# Patient Record
Sex: Female | Born: 1945 | Race: Asian | Hispanic: No | Smoking: Never smoker
Health system: Southern US, Community
[De-identification: ages and names within clinical notes are randomized; demographics above are authoritative.]

## PROBLEM LIST (undated history)

## (undated) DIAGNOSIS — B181 Chronic viral hepatitis B without delta-agent: Secondary | ICD-10-CM

## (undated) DIAGNOSIS — I10 Essential (primary) hypertension: Secondary | ICD-10-CM

## (undated) DIAGNOSIS — M1990 Unspecified osteoarthritis, unspecified site: Secondary | ICD-10-CM

## (undated) DIAGNOSIS — K255 Chronic or unspecified gastric ulcer with perforation: Secondary | ICD-10-CM

## (undated) DIAGNOSIS — M810 Age-related osteoporosis without current pathological fracture: Secondary | ICD-10-CM

## (undated) HISTORY — DX: Chronic or unspecified gastric ulcer with perforation: K25.5

## (undated) HISTORY — DX: Chronic viral hepatitis B without delta-agent: B18.1

## (undated) HISTORY — DX: Essential (primary) hypertension: I10

## (undated) HISTORY — DX: Unspecified osteoarthritis, unspecified site: M19.90

---

## 1992-02-29 HISTORY — PX: OTHER SURGICAL HISTORY: SHX169

## 2010-07-07 ENCOUNTER — Encounter: Payer: Self-pay | Admitting: Family Medicine

## 2010-07-07 ENCOUNTER — Ambulatory Visit (INDEPENDENT_AMBULATORY_CARE_PROVIDER_SITE_OTHER): Payer: Medicare Other | Admitting: Family Medicine

## 2010-07-07 VITALS — BP 157/88 | HR 78 | Ht 59.0 in | Wt 90.0 lb

## 2010-07-07 DIAGNOSIS — R634 Abnormal weight loss: Secondary | ICD-10-CM

## 2010-07-07 DIAGNOSIS — Z0000 Encounter for general adult medical examination without abnormal findings: Secondary | ICD-10-CM

## 2010-07-07 DIAGNOSIS — Z130 Encounter for screening for diseases of the blood and blood-forming organs and certain disorders involving the immune mechanism: Secondary | ICD-10-CM

## 2010-07-07 DIAGNOSIS — Z780 Asymptomatic menopausal state: Secondary | ICD-10-CM

## 2010-07-07 DIAGNOSIS — Z23 Encounter for immunization: Secondary | ICD-10-CM

## 2010-07-07 DIAGNOSIS — Z13228 Encounter for screening for other metabolic disorders: Secondary | ICD-10-CM

## 2010-07-07 DIAGNOSIS — Z13220 Encounter for screening for lipoid disorders: Secondary | ICD-10-CM

## 2010-07-07 MED ORDER — PNEUMOCOCCAL VAC POLYVALENT 25 MCG/0.5ML IJ INJ: 0.5000 mL | INJECTION | Freq: Once | INTRAMUSCULAR | Status: DC

## 2010-07-07 MED ORDER — TETANUS-DIPHTH-ACELL PERTUSSIS 5-2.5-18.5 LF-MCG/0.5 IM SUSP: 0.5000 mL | Freq: Once | INTRAMUSCULAR | Status: DC

## 2010-07-07 MED ORDER — BETAMETHASONE DIPROPIONATE 0.05 % EX LOTN: TOPICAL_LOTION | Freq: Two times a day (BID) | CUTANEOUS | Status: AC

## 2010-07-07 NOTE — Patient Instructions (Signed)
Update fasting labs one morning downstairs.  Call [phone] to schedule your bone density test downstairs.  Take MVI + Citrical with D twice daily.

## 2010-07-07 NOTE — Progress Notes (Signed)
  Subjective:    Patient ID: Debra Garcia, female    DOB: [birth year], 65 y.o.   MRN: [id number]  HPI 65 yo [place] female presents for NOV physical.  It has been years since her last physical.  She is a previously healthy female.  She has declined doing mammograms and pap smears.  She has been widowed since 2001 and denies vaginal bleeding or discharge.  She is overdue for a colonoscopy and reports weight loss over the past 6 mos.  She has fam hx of GI cancer.  She is due for fasting labs and a bone density scan.  She rarely takes calcium supplement.  Denies chest pain or DOE.  BP 157/88  Pulse 78  Ht 4\' 11"  (1.499 m)  Wt 90 lb (40.824 kg)  BMI 18.18 kg/m2  SpO2 98%  History reviewed. No pertinent past medical history.  Past Surgical History  Procedure Date  . Ulcer surgery 1994    Family History  Problem Relation Age of Onset  . Cancer Brother     History   Social History  . Marital Status: Unknown    Spouse Name: widowed    Number of Children: 2  . Years of Education: N/A   Occupational History  . retired    Social History Main Topics  . Smoking status: Never Smoker   . Smokeless tobacco: Not on file  . Alcohol Use: No  . Drug Use: No  . Sexually Active: No   Other Topics Concern  . Not on file   Social History Narrative  . No narrative on file    Not on File  Current outpatient prescriptions:betamethasone dipropionate 0.05 % lotion, Apply topically 2 (two) times daily., Disp: 60 mL, Rfl: 0 Current facility-administered medications:pneumococcal 23 valent vaccine (PNU-IMMUNE) injection 0.5 mL, 0.5 mL, Intramuscular, Once, Seymour Bars, DO;  TDaP (BOOSTRIX) injection 0.5 mL, 0.5 mL, Intramuscular, Once, Seymour Bars, DO       Review of Systems + itchy patch of skin on back;  Gen: no fevers, chills, hot flashes, night sweats, change in weight GI: no N/V/C/D GU: no dysuria, incontinence or sexual dysfunction CV: no chest pain, DOE, palpitations s or edema Pulm:   Denies CP, SOB or chronic cough     Objective:   Physical Exam    Gen: alert, well groomed in NAD,m underwt.  Son is here to translate. Neck: no thyromegaly or cervical lymphadenopathy CV: RRR w/o murmur, no audible carotid bruits or abdominal aortic bruits Ext: no edema, clubbing or cyanosis Lungs: CTA bilat w/o W/R/R; nonlabored HEENT:  [place]/AT; PERRLA; oropharynx pink and moist with good dentition Abd: soft, NT, ND, NABS, No HSM, no audible AA bruits Skin: warm and dry; no rash, pallor or jaundice Psych: does not appear anxious or depressed; answers questions appropriately     Assessment & Plan:  Assesment:  1. CPE- Keeping healthy checklist for women reviewed today.  BP at goal.  BMI 18 in the underwt range.       Labs ordered Colonoscopy/ EGD scheduled. Pap due but she is low risk and declined updating. Mammogram due but she declined. Encouraged healthy diet, regular exercise, MVI daily.Citrical with D 2 x a day. Return for next physical in 1 yr.   DEXA scheduled. Pneumovax and Tdap today.

## 2010-07-17 ENCOUNTER — Telehealth: Payer: Self-pay | Admitting: Family Medicine

## 2010-07-17 LAB — COMPLETE METABOLIC PANEL WITH GFR
AST: 23 U/L (ref 0–37)
Albumin: 4.5 g/dL (ref 3.5–5.2)
Alkaline Phosphatase: 75 U/L (ref 39–117)
Potassium: 4.2 mEq/L (ref 3.5–5.3)
Sodium: 141 mEq/L (ref 135–145)
Total Protein: 7 g/dL (ref 6.0–8.3)

## 2010-07-17 LAB — CBC WITH DIFFERENTIAL/PLATELET
Basophils Absolute: 0.1 10*3/uL (ref 0.0–0.1)
Eosinophils Relative: 2 % (ref 0–5)
HCT: 42.3 % (ref 36.0–46.0)
Hemoglobin: 13.6 g/dL (ref 12.0–15.0)
Lymphocytes Relative: 40 % (ref 12–46)
MCHC: 32.2 g/dL (ref 30.0–36.0)
MCV: 91.2 fL (ref 78.0–100.0)
Monocytes Absolute: 0.4 10*3/uL (ref 0.1–1.0)
Monocytes Relative: 11 % (ref 3–12)
RDW: 13.2 % (ref 11.5–15.5)
WBC: 3.8 10*3/uL — ABNORMAL LOW (ref 4.0–10.5)

## 2010-07-17 NOTE — Telephone Encounter (Signed)
Pls let pt know that her fasting sugar, liver and kidney function came back normal.  Thyroid function and hemoglobin are normal.  WBC is slightly low.  Repeat CBC only in 6 wks.

## 2010-07-20 ENCOUNTER — Telehealth: Payer: Self-pay | Admitting: Family Medicine

## 2010-07-20 NOTE — Telephone Encounter (Signed)
Son called and wants to know an update on referral for patient for bone density test.  In system but pts son has not gotten a call regarding this.  Please check on and call pts son with date/ time of appt. Lab results were given to pts son per his request.  Voiced understanding regarding the results. Plan:  Routed to Michaelle Copas to check on referral process. Jarvis Newcomer, LPN [facility]

## 2010-07-21 NOTE — Telephone Encounter (Signed)
Pt's son aware of the above 

## 2010-07-30 NOTE — Telephone Encounter (Signed)
Will route this call since doesn't go to referrals to Payton Spark, CMA  Since she is Dr. Cathey Endow medical assistant to order the bone density test.  Please call the pts son when this has been done.  Thanks. Jarvis Newcomer, LPN [facility]

## 2010-07-30 NOTE — Telephone Encounter (Signed)
This referral did not come to my workque, this is usually a procedure the nurse just faxes downstairs to imaging and the patient schedules a appointment? Thanks

## 2010-07-30 NOTE — Telephone Encounter (Signed)
Sondra from [facility] will call son today and schedule for next week.

## 2010-08-17 ENCOUNTER — Ambulatory Visit
Admission: RE | Admit: 2010-08-17 | Discharge: 2010-08-17 | Disposition: A | Discharge status: Home / Self Care | Payer: Medicare Other | Source: Ambulatory Visit | Attending: Family Medicine | Admitting: Family Medicine

## 2010-08-18 ENCOUNTER — Telehealth: Payer: Self-pay | Admitting: Family Medicine

## 2010-08-18 DIAGNOSIS — M810 Age-related osteoporosis without current pathological fracture: Secondary | ICD-10-CM | POA: Insufficient documentation

## 2010-08-18 NOTE — Telephone Encounter (Signed)
Pls let pt know that her bone density test came back + for osteoporosis (thin bones).  She will need treatment for this, but we have several options.  Schedule Ov with me in the next 2 wks to discuss and go ahead and add caltrate D 2 x a day- 1 tab with breakfast and 1 tab with dinner.

## 2010-08-18 NOTE — Telephone Encounter (Signed)
Pt's son aware of the above and will CB to schedule

## 2010-08-26 ENCOUNTER — Encounter: Payer: Self-pay | Admitting: Family Medicine

## 2010-08-26 ENCOUNTER — Ambulatory Visit (INDEPENDENT_AMBULATORY_CARE_PROVIDER_SITE_OTHER): Payer: Medicare Other | Admitting: Family Medicine

## 2010-08-26 DIAGNOSIS — I10 Essential (primary) hypertension: Secondary | ICD-10-CM | POA: Insufficient documentation

## 2010-08-26 DIAGNOSIS — M810 Age-related osteoporosis without current pathological fracture: Secondary | ICD-10-CM

## 2010-08-26 MED ORDER — ATENOLOL 50 MG PO TABS: 50.0000 mg | ORAL_TABLET | Freq: Every day | ORAL | Status: DC

## 2010-08-26 NOTE — Patient Instructions (Signed)
Call when you are ready to set up Reclast once yearly infusion.  Start Caltrate D or Citrical with D with breakfast and dinner.

## 2010-08-26 NOTE — Assessment & Plan Note (Signed)
T score -3.1 on recent DEXA, new dx with normal CBC and CMP.  Talked to pt and son about options and given her hx of esophagitis, I think Reclast once a year IV infusion is the best option.  Given pamphlet.  Son will call us when ready to schedule but she is agreeable and understands that she must hydrate well and take Tylenol prior to infusion.  She is to add Citrical D 1 tab bidac in addition.

## 2010-08-26 NOTE — Progress Notes (Signed)
  Subjective:    Patient ID: Debra Garcia, female    DOB: May 28, 1945, 65 y.o.   MRN: [id number]  HPI 65 yo thin Asian female here to review her recent DEXA which was + for osteoporosis in the hip and L spine, with a T score of -3.1.  She has never been told that she's had osteoporosis.  She is not routinely taking calcium or D.  Her CBC was normal other than a WBC of 3.8 and her CMP was normal.  She has no hx of chronic steroid use or smoking.  Denies hx of fragility fx or loss of height.  She has some heartburn problems.    BP 148/92  Pulse 95  Ht 5' (1.524 m)  Wt 87 lb (39.463 kg)  BMI 16.99 kg/m2   Review of Systems  Constitutional: Negative for appetite change, fatigue and unexpected weight change.  Respiratory: Negative for shortness of breath.   Cardiovascular: Negative for chest pain and leg swelling.  Musculoskeletal: Negative for myalgias, back pain and arthralgias.       Objective:   Physical Exam  Constitutional: She appears well-developed and well-nourished.       Thin, pleasant.  Here with son to help translate  Psychiatric: She has a normal mood and affect.          Assessment & Plan:

## 2010-08-26 NOTE — Assessment & Plan Note (Signed)
BP has been > 140/90 x 2 visits now.  Will add Atenolol 50 mg once daily and recheck her in 3 mos.  Will need to do a fasting lipid.

## 2010-09-14 ENCOUNTER — Telehealth: Payer: Self-pay | Admitting: Family Medicine

## 2010-09-14 NOTE — Telephone Encounter (Signed)
Pts son called and said Dr. Cathey Endow had instructed them to call back when his mother was ready to do IV treatments for osteoporosis.  Pt is ready to be scheduled.  Please call the pt's son when done at [phone]. Plan:  Routed to Dr. Arlice Colt, LPN [facility]

## 2010-09-16 ENCOUNTER — Telehealth: Payer: Self-pay | Admitting: Family Medicine

## 2010-09-16 MED ORDER — ZOLEDRONIC ACID 5 MG/100ML IV SOLN: 5.0000 mg | Freq: Once | INTRAVENOUS | Status: DC

## 2010-09-16 NOTE — Telephone Encounter (Signed)
RX printed 

## 2010-09-16 NOTE — Telephone Encounter (Signed)
Per Dr Cathey Endow pt is sched for  Reclast Monday, July 30 at 8:45/9:00 at [facility]. Son, Brynda Greathouse is notified and agreed pt wishes to start tx. Orders faxed to [phone].

## 2010-09-27 ENCOUNTER — Telehealth: Payer: Self-pay | Admitting: *Deleted

## 2010-09-27 NOTE — Telephone Encounter (Signed)
Lab order

## 2010-10-05 ENCOUNTER — Encounter: Payer: Self-pay | Admitting: Family Medicine

## 2010-10-05 ENCOUNTER — Ambulatory Visit (INDEPENDENT_AMBULATORY_CARE_PROVIDER_SITE_OTHER): Payer: Medicare Other | Admitting: Family Medicine

## 2010-10-05 DIAGNOSIS — M810 Age-related osteoporosis without current pathological fracture: Secondary | ICD-10-CM

## 2010-10-05 DIAGNOSIS — I10 Essential (primary) hypertension: Secondary | ICD-10-CM

## 2010-10-05 NOTE — Assessment & Plan Note (Signed)
BP is still high because she never picked up her BP medication.  I had a long talk with her today about why we treat HTN and prevention of stroke and MI.  Recommended that she start her Atenolol today and take everyday.  Her CMP is UTD but she needs an FLP.  RTC in 3 mos for this.

## 2010-10-05 NOTE — Progress Notes (Signed)
  Subjective:    Patient ID: Debra Garcia, female    DOB: 07-18-1945, 65 y.o.   MRN: [id number]  HPI  65 yo AF presents for f/u visit.  Did reclast infusion last wk.  She never did start the Atenolol for her BP.  She has just declined taking it and understands the risk of not taking it.  She plans to see Dr Jarold Motto back for GI care.  She denies any chest pain or SOB.  She is due to have her fasting cholesterol checked but does not want to do it today.    BP 150/81  Pulse 73  Ht 5' (1.524 m)  Wt 87 lb (39.463 kg)  BMI 16.99 kg/m2    Review of Systems  Constitutional: Negative for fatigue and unexpected weight change.  Eyes: Negative for visual disturbance.  Respiratory: Negative for shortness of breath.   Cardiovascular: Negative for chest pain, palpitations and leg swelling.  Genitourinary: Negative for difficulty urinating.  Neurological: Negative for headaches.       Objective:   Physical Exam  Constitutional: She appears well-developed and well-nourished.       Thin asian female, here with son  HENT:  Mouth/Throat: Oropharynx is clear and moist.  Eyes: Pupils are equal, round, and reactive to light.  Neck: Neck supple. No JVD present. No thyromegaly present.  Cardiovascular: Normal rate, regular rhythm and normal heart sounds.   Pulmonary/Chest: Effort normal and breath sounds normal.  Musculoskeletal: She exhibits no edema.  Psychiatric: She has a normal mood and affect.          Assessment & Plan:

## 2010-10-05 NOTE — Patient Instructions (Signed)
My recommendation is to take the Atenolol everyday for high BP.  Return for f/u BP and check fasting cholesterol in 3 mos.

## 2010-10-05 NOTE — Assessment & Plan Note (Signed)
Did her first Reclast infusion in July and did great with it.  She is to also take calcium with D daily.

## 2010-11-04 ENCOUNTER — Ambulatory Visit: Payer: Medicare Other | Admitting: Internal Medicine

## 2010-11-29 ENCOUNTER — Encounter: Payer: Self-pay | Admitting: Internal Medicine

## 2010-11-29 ENCOUNTER — Other Ambulatory Visit: Payer: Medicare Other

## 2010-11-29 ENCOUNTER — Ambulatory Visit (INDEPENDENT_AMBULATORY_CARE_PROVIDER_SITE_OTHER): Payer: Medicare Other | Admitting: Internal Medicine

## 2010-11-29 VITALS — BP 144/80 | HR 72 | Ht 60.0 in | Wt 88.0 lb

## 2010-11-29 DIAGNOSIS — K759 Inflammatory liver disease, unspecified: Secondary | ICD-10-CM

## 2010-11-29 DIAGNOSIS — B181 Chronic viral hepatitis B without delta-agent: Secondary | ICD-10-CM

## 2010-11-29 DIAGNOSIS — R634 Abnormal weight loss: Secondary | ICD-10-CM

## 2010-11-29 DIAGNOSIS — K589 Irritable bowel syndrome without diarrhea: Secondary | ICD-10-CM | POA: Insufficient documentation

## 2010-11-29 DIAGNOSIS — Z1159 Encounter for screening for other viral diseases: Secondary | ICD-10-CM

## 2010-11-29 DIAGNOSIS — R197 Diarrhea, unspecified: Secondary | ICD-10-CM

## 2010-11-29 DIAGNOSIS — Z23 Encounter for immunization: Secondary | ICD-10-CM

## 2010-11-29 DIAGNOSIS — R6889 Other general symptoms and signs: Secondary | ICD-10-CM

## 2010-11-29 DIAGNOSIS — Z8711 Personal history of peptic ulcer disease: Secondary | ICD-10-CM

## 2010-11-29 MED ORDER — PEG-KCL-NACL-NASULF-NA ASC-C 100 G PO SOLR: 1.0000 | Freq: Once | ORAL | Status: DC

## 2010-11-29 NOTE — Assessment & Plan Note (Signed)
Chronic and post-prandial. Suspect related to prior surgery but endoscopic evaluation will help. Doubt celiac (Asian) but keep in mind.

## 2010-11-29 NOTE — Assessment & Plan Note (Signed)
Long hx of this Reassess serologic status and check HAV status as may need vaccination Has had pneumovax and Tdap booster 2012 Influenza today

## 2010-11-29 NOTE — Assessment & Plan Note (Signed)
Hard to sort out completely. Sounds like had some more this year and also lost after husband died 10 years ago. Labs with CBC, CMP and TSH ok in May 2012. I suspect this is not a serious health issue but given prior PUD and surgery will perform EGD. May need liver imaging given hepatitis B carrier status

## 2010-11-29 NOTE — Patient Instructions (Signed)
Please go to the basement upon leaving today to have your labs done. You have been scheduled for an Endoscopy/Colonoscopy with separate instructions given. Your prep kit has been sent to your pharmacy for you to pick up. 

## 2010-11-29 NOTE — Assessment & Plan Note (Signed)
Given this and surgery, together with weight loss and diarrhea, EGD to evaluate

## 2010-11-29 NOTE — Progress Notes (Signed)
  Subjective:    Patient ID: Debra Garcia, female    DOB: September 12, 1945, 65 y.o.   MRN: [id number]  HPI Usually 100-105 #. Now 88#. Change noticed after husband died about a decade ago but got sick with nausea and vomiting in January 2012 and could not eat well. Has not gained weight since then. Some urgent defecation after eating that is chronic. No other GI symptoms now. No recent procedures.  Chronic Hepatitis B carrier. Speaks limited English- best friend (also my patient) translates.   GYN exam records reviewed and scanned. No major issues. Labs 06/2010 ok - in Epic Patient cares for 47 month old grandson during the day . Lives alone.    Review of Systems All other ROS negative.     Objective:   Physical Exam General: thin but Well-developed, well-nourished and in no acute distress - Asian woman Vitals: Reviewed and listed above Eyes:anicteric. Mouth and posterior pharynx: normal.  Neck: supple w/o thyromegaly or mass.  Lungs: clear. Heart: S1S2, no rubs, murmurs, gallops. Abdomen: soft, non-tender, no hepatosplenomegaly, hernia, or mass and BS+.  Lymphatics: no cervical, [place] or inguinal nodes. Extremities:  no edema Skin no rash. Neuro: nonfocal.  Psych: appropriate mood and  affect.        Assessment & Plan:

## 2010-11-30 LAB — HEPATITIS A ANTIBODY, TOTAL: Hep A Total Ab: POSITIVE — AB

## 2010-12-08 ENCOUNTER — Encounter: Payer: Self-pay | Admitting: Internal Medicine

## 2010-12-08 NOTE — Progress Notes (Signed)
Quick Note:  Please contact her re: Hepatitis B carrier status  I need her to have the following labs:  Hepatitis B e antigen Hepatitis Be Antibody Hepatitis C antibody Hepatitis B DNA level Alpha fetoprotein  Also Ultrasound re: Chronic Hepatitis B ______

## 2010-12-09 ENCOUNTER — Telehealth: Payer: Self-pay

## 2010-12-09 DIAGNOSIS — B181 Chronic viral hepatitis B without delta-agent: Secondary | ICD-10-CM

## 2010-12-09 NOTE — Telephone Encounter (Signed)
I spoke with the patient's son he is advised of [place] scheduled at [facility] for 12/13/10 8:30.  He verbalized understanding to be NPO after midnight.  He is also advised of the need for lab work.  They may need to reschedule the [place] and I have provided him the number to call

## 2010-12-09 NOTE — Telephone Encounter (Signed)
Message copied by Annett Fabian on Thu Dec 09, 2010  2:04 PM ------      Message from: Stan Head E      Created: Wed Dec 08, 2010  6:52 PM       Please contact her re: Hepatitis B carrier status            I need her to have the following labs:            Hepatitis B e antigen      Hepatitis Be Antibody      Hepatitis C antibody      Hepatitis B DNA level      Alpha fetoprotein            Also Ultrasound re: Chronic Hepatitis B

## 2010-12-10 ENCOUNTER — Other Ambulatory Visit: Payer: Medicare Other

## 2010-12-10 DIAGNOSIS — B181 Chronic viral hepatitis B without delta-agent: Secondary | ICD-10-CM

## 2010-12-13 ENCOUNTER — Ambulatory Visit (HOSPITAL_COMMUNITY)
Admission: RE | Admit: 2010-12-13 | Discharge: 2010-12-13 | Disposition: A | Discharge status: Home / Self Care | Payer: Medicare Other | Source: Ambulatory Visit | Attending: Internal Medicine | Admitting: Internal Medicine

## 2010-12-13 DIAGNOSIS — B181 Chronic viral hepatitis B without delta-agent: Secondary | ICD-10-CM | POA: Insufficient documentation

## 2010-12-13 DIAGNOSIS — I700 Atherosclerosis of aorta: Secondary | ICD-10-CM | POA: Insufficient documentation

## 2010-12-13 LAB — HEPATITIS B E ANTIBODY: Hepatitis Be Antibody: POSITIVE — AB

## 2010-12-13 LAB — HEPATITIS B E ANTIGEN: Hepatitis Be Antigen: NEGATIVE

## 2010-12-28 ENCOUNTER — Encounter: Payer: Self-pay | Admitting: Internal Medicine

## 2010-12-28 NOTE — Progress Notes (Signed)
Quick Note:  [place] is ok Has chronic hepatitis B - will explain more at endoscopy appointments - will need a quantitative Hep B DNA level ______

## 2010-12-30 ENCOUNTER — Other Ambulatory Visit: Payer: Self-pay

## 2010-12-30 DIAGNOSIS — B1910 Unspecified viral hepatitis B without hepatic coma: Secondary | ICD-10-CM

## 2011-01-05 ENCOUNTER — Other Ambulatory Visit: Payer: Medicare Other

## 2011-01-05 ENCOUNTER — Ambulatory Visit (AMBULATORY_SURGERY_CENTER): Payer: Medicare Other | Admitting: Internal Medicine

## 2011-01-05 ENCOUNTER — Encounter: Payer: Self-pay | Admitting: Internal Medicine

## 2011-01-05 DIAGNOSIS — R109 Unspecified abdominal pain: Secondary | ICD-10-CM

## 2011-01-05 DIAGNOSIS — K2970 Gastritis, unspecified, without bleeding: Secondary | ICD-10-CM

## 2011-01-05 DIAGNOSIS — R634 Abnormal weight loss: Secondary | ICD-10-CM

## 2011-01-05 DIAGNOSIS — Z8711 Personal history of peptic ulcer disease: Secondary | ICD-10-CM

## 2011-01-05 DIAGNOSIS — R197 Diarrhea, unspecified: Secondary | ICD-10-CM

## 2011-01-05 DIAGNOSIS — K2940 Chronic atrophic gastritis without bleeding: Secondary | ICD-10-CM

## 2011-01-05 DIAGNOSIS — B181 Chronic viral hepatitis B without delta-agent: Secondary | ICD-10-CM

## 2011-01-05 DIAGNOSIS — Z1211 Encounter for screening for malignant neoplasm of colon: Secondary | ICD-10-CM

## 2011-01-05 DIAGNOSIS — B1910 Unspecified viral hepatitis B without hepatic coma: Secondary | ICD-10-CM

## 2011-01-05 HISTORY — PX: COLONOSCOPY: SHX174

## 2011-01-05 HISTORY — PX: ESOPHAGOGASTRODUODENOSCOPY: SHX1529

## 2011-01-05 MED ORDER — DICYCLOMINE HCL 10 MG PO CAPS: 10.0000 mg | ORAL_CAPSULE | Freq: Three times a day (TID) | ORAL | Status: AC

## 2011-01-05 MED ORDER — SODIUM CHLORIDE 0.9 % IV SOLN: 500.0000 mL | INTRAVENOUS | Status: DC

## 2011-01-05 NOTE — Progress Notes (Signed)
No complaints noted in the recovery room.  Pt to the lab for blood work before discharge to home.  maw

## 2011-01-05 NOTE — Patient Instructions (Addendum)
1) The stomach looked irritated. This is called gastritis. I took biopsied and will call with the results and treatment plans.  2) The colonoscopy was ok - you have some diverticulosis which is not usually a problem.  3) I do think you have Irritable Bowel Syndrome which causes stomach/abdomnal pain and urgent bowel movements. I want you to try the medication called dicyclomine. A prescription was sent to your pharmacy. This medication stops stomach and intestine spasms. You will take it before meals.  4) You will also go to the basement and have blood tests taken today to check on the hepatitis. The test is Hepatitis B DNA ultraquantitative.  I will let you know the results and need for follow-up visit. Please ead the information provided.  Iva Boop, MD, [facility]  Please resume your prior medications today and call if any questions or concerns.

## 2011-01-06 ENCOUNTER — Telehealth: Payer: Self-pay | Admitting: *Deleted

## 2011-01-06 NOTE — Telephone Encounter (Signed)
Follow up Call- Patient questions:  Do you have a fever, pain , or abdominal swelling? no Pain Score  0 *  Have you tolerated food without any problems? yes  Have you been able to return to your normal activities? yes  Do you have any questions about your discharge instructions: Diet   no Medications  no Follow up visit  no  Do you have questions or concerns about your Care? no  Actions: * If pain score is 4 or above: No action needed, pain <4.  Spoke with pt son

## 2011-01-10 LAB — HEPATITIS B DNA, ULTRAQUANTITATIVE, PCR: Hepatitis B DNA (Calc): 902 copies/mL — ABNORMAL HIGH (ref <116)

## 2011-01-12 ENCOUNTER — Encounter: Payer: Self-pay | Admitting: Internal Medicine

## 2011-01-12 NOTE — Progress Notes (Signed)
Quick Note:  Let her know mild stomach inflammation - common and not a bad problem hopefully dicyclomine is working - if not may schedule a follow-up if she desires  Also let her know there are low levels of Hep B DNA so just a carrier as before - she should have an annual visit with me about this 12/2011 place recall ______

## 2011-09-20 ENCOUNTER — Telehealth: Payer: Self-pay | Admitting: Internal Medicine

## 2011-09-20 NOTE — Telephone Encounter (Signed)
Spoke with patient's son. He is calling about a Reclast infusion she had with her PCP last year. He will call her PCP for this

## 2011-09-26 ENCOUNTER — Ambulatory Visit (INDEPENDENT_AMBULATORY_CARE_PROVIDER_SITE_OTHER): Payer: Medicare Other | Admitting: Family Medicine

## 2011-09-26 ENCOUNTER — Encounter: Payer: Self-pay | Admitting: Family Medicine

## 2011-09-26 VITALS — BP 140/80 | HR 70 | Ht 60.0 in | Wt 93.0 lb

## 2011-09-26 DIAGNOSIS — I10 Essential (primary) hypertension: Secondary | ICD-10-CM

## 2011-09-26 DIAGNOSIS — M810 Age-related osteoporosis without current pathological fracture: Secondary | ICD-10-CM

## 2011-09-26 LAB — BASIC METABOLIC PANEL WITH GFR
CO2: 28 mEq/L (ref 19–32)
Calcium: 10 mg/dL (ref 8.4–10.5)
Chloride: 105 mEq/L (ref 96–112)
Creat: 0.83 mg/dL (ref 0.50–1.10)
Glucose, Bld: 81 mg/dL (ref 70–99)

## 2011-09-26 NOTE — Patient Instructions (Addendum)
Check with insurance to see when due for mammogram and if covered.  We will schedule your reclast infusion.    1.5 Gram Low Sodium Diet A 1.5 gram sodium diet restricts the amount of sodium in the diet to no more than 1.5 g or 1500 mg daily. The American Heart Association recommends Americans over the age of 74 to consume no more than 1500 mg of sodium each day to reduce the risk of developing high blood pressure. Research also shows that limiting sodium may reduce heart attack and stroke risk. Many foods contain sodium for flavor and sometimes as a preservative. When the amount of sodium in a diet needs to be low, it is important to know what to look for when choosing foods and drinks. The following includes some information and guidelines to help make it easier for you to adapt to a low sodium diet. QUICK TIPS  Do not add salt to food.   Avoid convenience items and fast food.   Choose unsalted snack foods.   Buy lower sodium products, often labeled as "lower sodium" or "no salt added."   Check food labels to learn how much sodium is in 1 serving.   When eating at a restaurant, ask that your food be prepared with less salt or none, if possible.  READING FOOD LABELS FOR SODIUM INFORMATION The nutrition facts label is a good place to find how much sodium is in foods. Look for products with no more than 400 mg of sodium per serving. Remember that 1.5 g = 1500 mg. The food label may also list foods as:  Sodium-free: Less than 5 mg in a serving.   Very low sodium: 35 mg or less in a serving.   Low-sodium: 140 mg or less in a serving.   Light in sodium: 50% less sodium in a serving. For example, if a food that usually has 300 mg of sodium is changed to become light in sodium, it will have 150 mg of sodium.   Reduced sodium: 25% less sodium in a serving. For example, if a food that usually has 400 mg of sodium is changed to reduced sodium, it will have 300 mg of sodium.  CHOOSING  FOODS Grains  Avoid: Salted crackers and snack items. Some cereals, including instant hot cereals. Bread stuffing and biscuit mixes. Seasoned rice or pasta mixes.   Choose: Unsalted snack items. Low-sodium cereals, oats, puffed wheat and rice, shredded wheat. English muffins and bread. Pasta.  Meats  Avoid: Salted, canned, smoked, spiced, pickled meats, including fish and poultry. Bacon, ham, sausage, cold cuts, hot dogs, anchovies.   Choose: Low-sodium canned tuna and salmon. Fresh or frozen meat, poultry, and fish.  Dairy  Avoid: Processed cheese and spreads. Cottage cheese. Buttermilk and condensed milk. Regular cheese.   Choose: Milk. Low-sodium cottage cheese. Yogurt. Sour cream. Low-sodium cheese.  Fruits and Vegetables  Avoid: Regular canned vegetables. Regular canned tomato sauce and paste. Frozen vegetables in sauces. Olives. Rosita Fire. Relishes. Sauerkraut.   Choose: Low-sodium canned vegetables. Low-sodium tomato sauce and paste. Frozen or fresh vegetables. Fresh and frozen fruit.  Condiments  Avoid: Canned and packaged gravies. Worcestershire sauce. Tartar sauce. Barbecue sauce. Soy sauce. Steak sauce. Ketchup. Onion, garlic, and table salt. Meat flavorings and tenderizers.   Choose: Fresh and dried herbs and spices. Low-sodium varieties of mustard and ketchup. Lemon juice. Tabasco sauce. Horseradish.  SAMPLE 1.5 GRAM SODIUM MEAL PLAN Breakfast / Sodium (mg)  1 cup low-fat milk / 143 mg  1 whole-wheat English muffin / 240 mg   1 tbs heart-healthy margarine / 153 mg   1 hard-boiled egg / 139 mg   1 small orange / 0 mg  Lunch / Sodium (mg)  1 cup raw carrots / 76 mg   2 tbs no salt added peanut butter / 5 mg   2 slices whole-wheat bread / 270 mg   1 tbs jelly / 6 mg    cup red grapes / 2 mg  Dinner / Sodium (mg)  1 cup whole-wheat pasta / 2 mg   1 cup low-sodium tomato sauce / 73 mg   3 oz lean ground beef / 57 mg   1 small side salad (1 cup raw  spinach leaves,  cup cucumber,  cup yellow bell pepper) with 1 tsp olive oil and 1 tsp red wine vinegar / 25 mg  Snack / Sodium (mg)  1 container low-fat vanilla yogurt / 107 mg   3 graham cracker squares / 127 mg  Nutrient Analysis  Calories: 1745   Protein: 75 g   Carbohydrate: 237 g   Fat: 57 g   Sodium: 1425 mg  Document Released: 02/14/2005 Document Revised: 02/03/2011 Document Reviewed: 05/18/2009 [facility] Patient Information 2012 [place], [place].

## 2011-09-26 NOTE — Progress Notes (Signed)
  Subjective:    Patient ID: Debra Garcia, female    DOB: [birth year], 66 y.o.   MRN: [id number]  HPI HTN - she has opted to not take meds for BP in the past. She is still not interested. Doesn't eat a low dalt diet.  No chest pain or shortness of breath.  Osteoporosis-she's here today so we can get her set up for a reconstitution. Her last one was about a year ago. Her last DEXA scan was in May 2012. She does have osteoporosis.   Review of Systems     Objective:   Physical Exam  Constitutional: She is oriented to person, place, and time. She appears well-developed and well-nourished.  HENT:  Head: Normocephalic and atraumatic.  Cardiovascular: Normal rate, regular rhythm and normal heart sounds.   Pulmonary/Chest: Effort normal and breath sounds normal.  Neurological: She is alert and oriented to person, place, and time.  Skin: Skin is warm and dry.  Psychiatric: She has a normal mood and affect. Her behavior is normal.          Assessment & Plan:  HTN - uncontrolled-we did discuss low salt diet today. She is very thin and is very active. We also discussed the importance of treating blood pressure as it does increase her risk of heart attack, stroke and renal disease. She still at this point continues to refuse blood pressure medications. Certainly that is up to her but I wanted her to understand the risks of not treating it. She can at least work on low-salt diet and that may help by about 5-10 points. Handout given. I encouraged her to keep an eye on this. She says she does have a cuff at home and to try to track her blood pressures at home.  Osteoporosis-we had her set up for a reconstitution. We will go ahead and get creatinine and calcium function today. We'll try to get her scheduled for either next week or the week after. [place] is [place] country [place] mid [place]. Make sure taking adequate calcium with vitamin D.

## 2011-09-27 ENCOUNTER — Telehealth: Payer: Self-pay | Admitting: *Deleted

## 2011-09-27 MED ORDER — ZOLEDRONIC ACID 5 MG/100ML IV SOLN: 5.0000 mg | Freq: Once | INTRAVENOUS | Status: AC

## 2011-09-27 NOTE — Telephone Encounter (Signed)
Reclast infusion schedule with [facility] for 10/07/11 @ 2:30pm. Son has been informed of appt.

## 2011-10-07 ENCOUNTER — Encounter (HOSPITAL_COMMUNITY): Payer: Medicare Other

## 2011-10-12 ENCOUNTER — Encounter (HOSPITAL_COMMUNITY)
Admission: RE | Admit: 2011-10-12 | Discharge: 2011-10-12 | Disposition: A | Discharge status: Home / Self Care | Payer: Medicare Other | Source: Ambulatory Visit | Attending: Family Medicine | Admitting: Family Medicine

## 2011-10-12 ENCOUNTER — Encounter (HOSPITAL_COMMUNITY): Payer: Self-pay

## 2011-10-12 DIAGNOSIS — M810 Age-related osteoporosis without current pathological fracture: Secondary | ICD-10-CM | POA: Insufficient documentation

## 2011-10-12 HISTORY — DX: Age-related osteoporosis without current pathological fracture: M81.0

## 2011-10-12 MED ORDER — SODIUM CHLORIDE 0.9 % IV SOLN
INTRAVENOUS | Status: DC
  Administered 2011-10-12: 250 mL via INTRAVENOUS

## 2011-10-12 MED ORDER — ZOLEDRONIC ACID 5 MG/100ML IV SOLN
5.0000 mg | Freq: Once | INTRAVENOUS | Status: AC
  Administered 2011-10-12: 5 mg via INTRAVENOUS
  Filled 2011-10-12: qty 100

## 2011-12-16 ENCOUNTER — Encounter: Payer: Self-pay | Admitting: Internal Medicine

## 2012-07-09 ENCOUNTER — Encounter: Payer: Self-pay | Admitting: Internal Medicine

## 2012-09-26 ENCOUNTER — Encounter: Payer: Self-pay | Admitting: Family Medicine

## 2012-09-26 ENCOUNTER — Ambulatory Visit (INDEPENDENT_AMBULATORY_CARE_PROVIDER_SITE_OTHER): Payer: Medicare Other | Admitting: Family Medicine

## 2012-09-26 VITALS — BP 132/88 | HR 70 | Wt 95.0 lb

## 2012-09-26 DIAGNOSIS — I10 Essential (primary) hypertension: Secondary | ICD-10-CM

## 2012-09-26 DIAGNOSIS — Z13220 Encounter for screening for lipoid disorders: Secondary | ICD-10-CM

## 2012-09-26 DIAGNOSIS — IMO0001 Reserved for inherently not codable concepts without codable children: Secondary | ICD-10-CM | POA: Insufficient documentation

## 2012-09-26 DIAGNOSIS — M810 Age-related osteoporosis without current pathological fracture: Secondary | ICD-10-CM

## 2012-09-26 DIAGNOSIS — R030 Elevated blood-pressure reading, without diagnosis of hypertension: Secondary | ICD-10-CM

## 2012-09-26 LAB — COMPLETE METABOLIC PANEL WITH GFR
Albumin: 4.6 g/dL (ref 3.5–5.2)
BUN: 22 mg/dL (ref 6–23)
CO2: 28 mEq/L (ref 19–32)
GFR, Est African American: 78 mL/min
GFR, Est Non African American: 68 mL/min
Glucose, Bld: 93 mg/dL (ref 70–99)
Potassium: 4.5 mEq/L (ref 3.5–5.3)
Sodium: 139 mEq/L (ref 135–145)
Total Bilirubin: 0.7 mg/dL (ref 0.3–1.2)
Total Protein: 7.4 g/dL (ref 6.0–8.3)

## 2012-09-26 LAB — LIPID PANEL: Cholesterol: 218 mg/dL — ABNORMAL HIGH (ref 0–200)

## 2012-09-26 NOTE — Progress Notes (Signed)
  Subjective:    Patient ID: Debra Garcia, female    DOB: 07-May-1945, 67 y.o.   MRN: [id number]  HPI HTN -  Pt denies chest pain, SOB, dizziness, or heart palpitations.  Taking meds as directed w/o problems.  Denies medication side effects.  Home BPs run 120/70s.    Osteoporosis - overall she's doing well. She denies any complaints. No recent falls or fractures. She did get her request and fusion last August and tolerated it well without any side effects or problems. She would like to have it done again this year. She is due for her next bone density test.  Review of Systems     Objective:   Physical Exam  Constitutional: She is oriented to person, place, and time. She appears well-developed and well-nourished.  HENT:  Head: Normocephalic and atraumatic.  Neck: Neck supple. No thyromegaly present.  Cardiovascular: Normal rate, regular rhythm and normal heart sounds.   No carotid or abdominal bruits.   Pulmonary/Chest: Effort normal and breath sounds normal.  Abdominal: Soft. Bowel sounds are normal. She exhibits no distension and no mass. There is no tenderness. There is no rebound and no guarding.  Lymphadenopathy:    She has no cervical adenopathy.  Neurological: She is alert and oriented to person, place, and time.  Skin: Skin is warm and dry.  Psychiatric: She has a normal mood and affect. Her behavior is normal.          Assessment & Plan:  White coat hypertension-it sounds like her home blood pressures have been fantastic. We repeated her blood pressure here came back down to normal which is very reassuring that we will definitely keep an eye on this. Check a CMP and fasting lipid panel today.  Osteoporosis - due for bone density.  Make sure taking adequate calcium and vitamin D.  Will check vit D testing.  We have her BUN creatinine and calcium levels. If these are normal then we can move forward with scheduling her reclast injection. We'll need to fill out the paperwork for  this. We'll call with results once available. Hx of esophagitis which is why dpoing the reclast.   Overdue for mammogram. Encouraged her call and get this scheduled. She says she had her last one done in 2012 of which are treated so we will try call and get the old report of possible.

## 2012-09-26 NOTE — Patient Instructions (Signed)
We will call you with appointment for the reclast infusion.   Please have your mammogram done this year to get up to date.   We will call you with your lab results.

## 2012-09-27 LAB — VITAMIN D 25 HYDROXY (VIT D DEFICIENCY, FRACTURES): Vit D, 25-Hydroxy: 38 ng/mL (ref 30–89)

## 2012-10-04 ENCOUNTER — Telehealth: Payer: Self-pay | Admitting: *Deleted

## 2012-10-04 NOTE — Telephone Encounter (Signed)
Myriam Jacobson from imaging called to let us know that she can't schedule the patient for the bone density until the 19th of this month due to the tech being on vacation.  Patient's son said she has to have the dexa before she gets the reclast & that the pt is leaving sept 4th & will be out of the country for the rest of the year.  Will she have enough time to do both before she leaves?  I wasn't sure how long it takes to get scheduled for the reclast or how that works.  Please advise

## 2012-10-04 NOTE — Telephone Encounter (Signed)
Call outpatient center [facility] where usually do hte infusions and ask them how quckly they can usually get someone in

## 2012-10-05 NOTE — Telephone Encounter (Signed)
Spoke with [place] & got pt an appt for 8-22 @ 1:00.  Order for reclast faxed.

## 2012-10-10 ENCOUNTER — Encounter: Payer: Self-pay | Admitting: Obstetrics

## 2012-10-16 ENCOUNTER — Ambulatory Visit (INDEPENDENT_AMBULATORY_CARE_PROVIDER_SITE_OTHER): Payer: Medicare Other

## 2012-10-16 DIAGNOSIS — M810 Age-related osteoporosis without current pathological fracture: Secondary | ICD-10-CM

## 2012-10-19 ENCOUNTER — Encounter (HOSPITAL_COMMUNITY): Payer: Self-pay

## 2012-10-19 ENCOUNTER — Encounter (HOSPITAL_COMMUNITY)
Admission: RE | Admit: 2012-10-19 | Discharge: 2012-10-19 | Disposition: A | Discharge status: Home / Self Care | Payer: Medicare Other | Source: Ambulatory Visit | Attending: Family Medicine | Admitting: Family Medicine

## 2012-10-19 ENCOUNTER — Other Ambulatory Visit (HOSPITAL_COMMUNITY): Payer: Self-pay | Admitting: Family Medicine

## 2012-10-19 DIAGNOSIS — M810 Age-related osteoporosis without current pathological fracture: Secondary | ICD-10-CM | POA: Insufficient documentation

## 2012-10-19 MED ORDER — SODIUM CHLORIDE 0.9 % IV SOLN
Freq: Once | INTRAVENOUS | Status: AC
  Administered 2012-10-19: 14:00:00 via INTRAVENOUS

## 2012-10-19 MED ORDER — ZOLEDRONIC ACID 5 MG/100ML IV SOLN
5.0000 mg | Freq: Once | INTRAVENOUS | Status: AC
  Administered 2012-10-19: 5 mg via INTRAVENOUS
  Filled 2012-10-19: qty 100

## 2013-08-03 IMAGING — US US ABDOMEN COMPLETE
1 series · 14 of 25 positions shown · non-contrast
Comparison: None

CLINICAL DATA: Chronic hepatitis B

COMPLETE ABDOMINAL ULTRASOUND

[Series 1: us abdomen complete · 0.24mm/px · 14 of 98 slices shown]
[im 1/98]
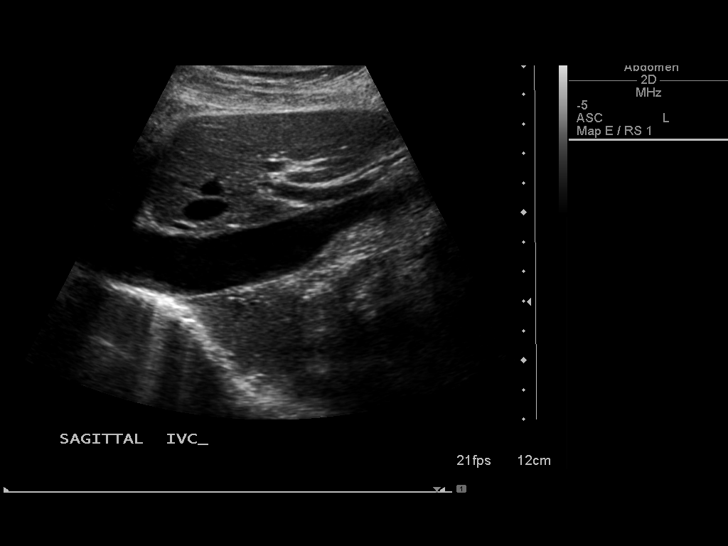
[im 9/98]
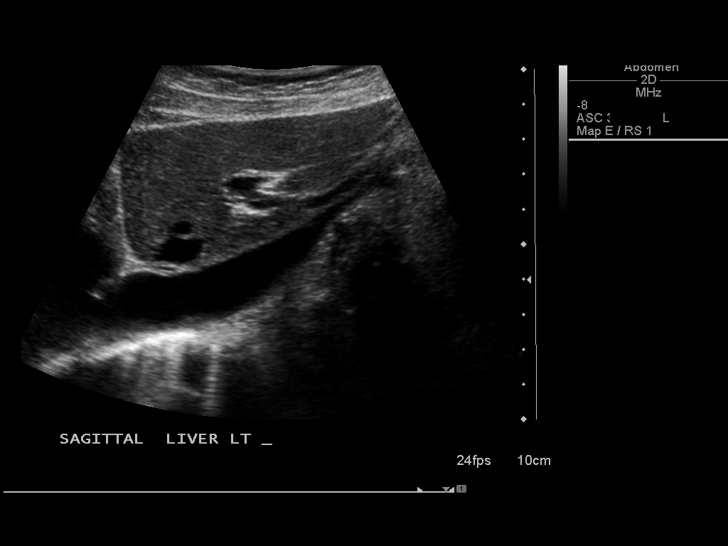
[im 17/98]
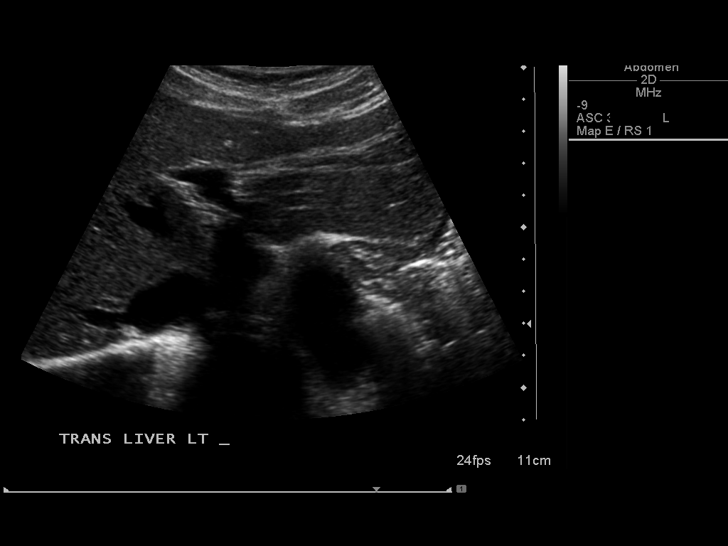
[im 25/98]
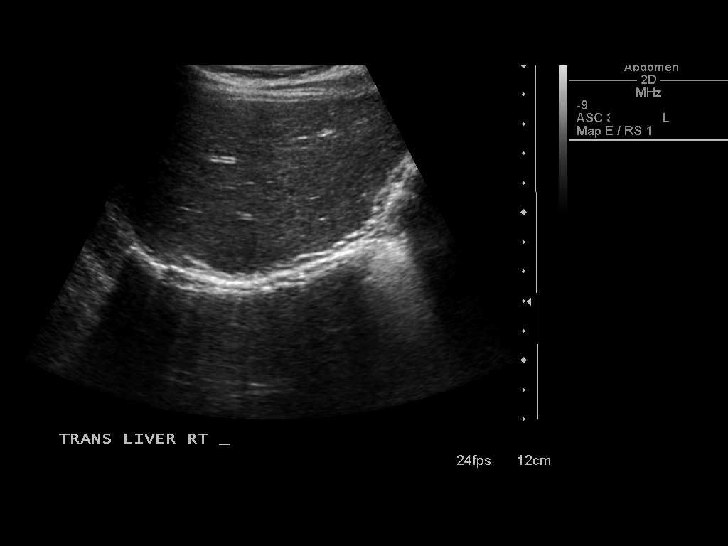
[im 33/98]
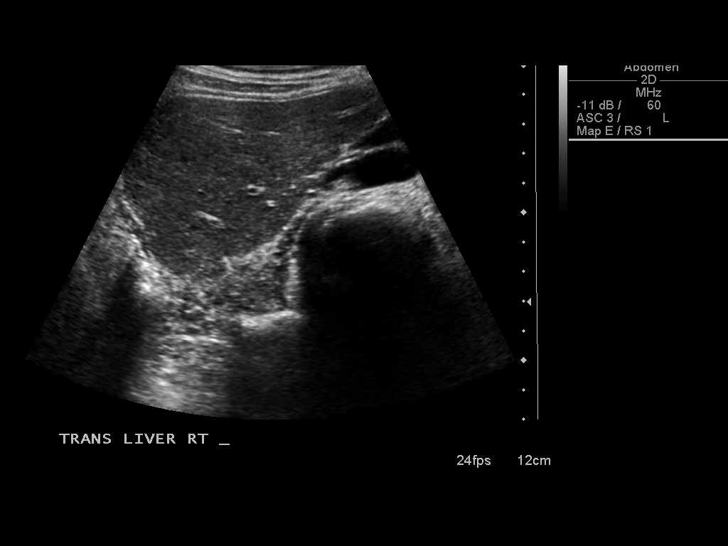
[im 37/98]
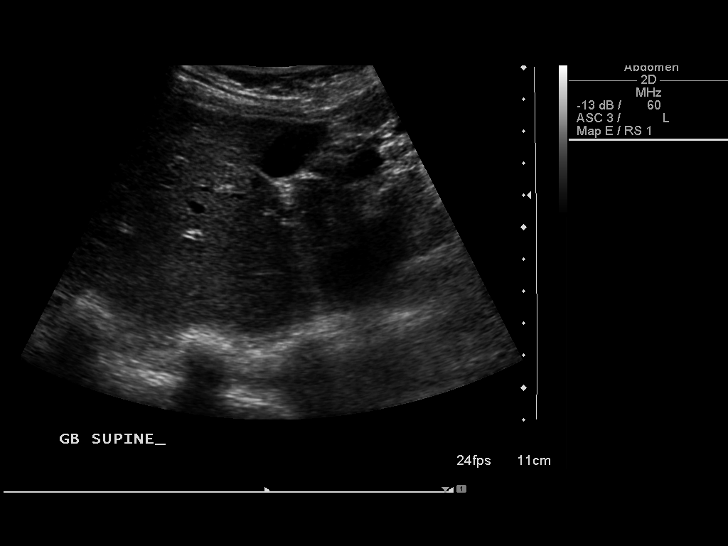
[im 45/98]
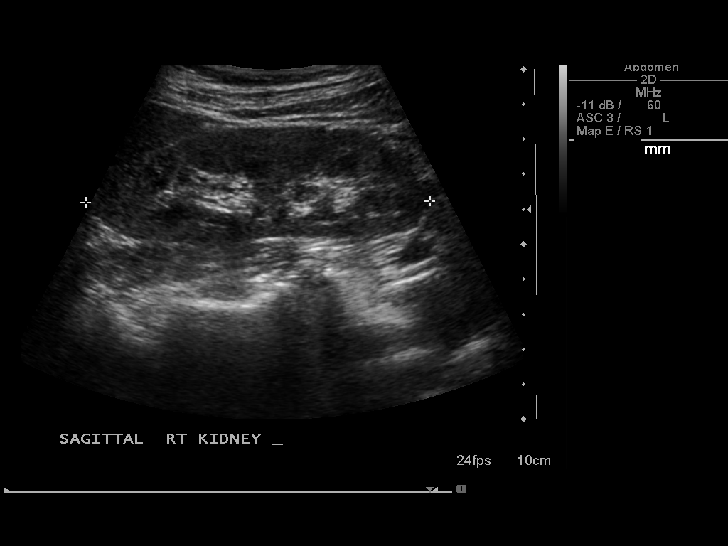
[im 53/98]
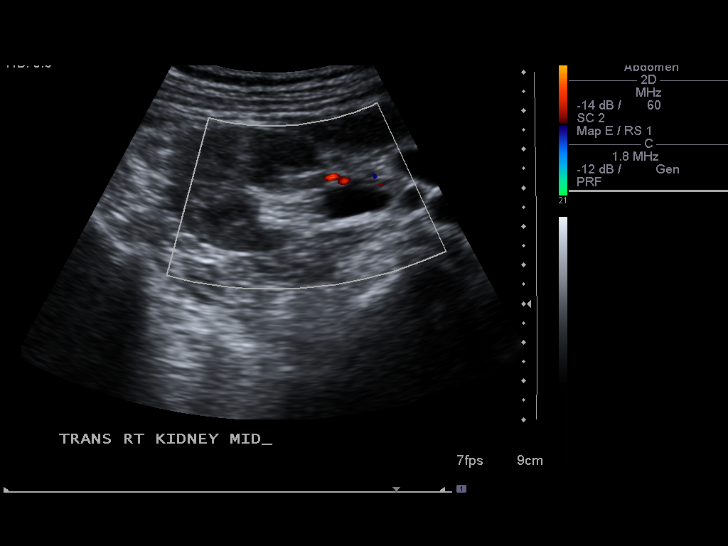
[im 61/98]
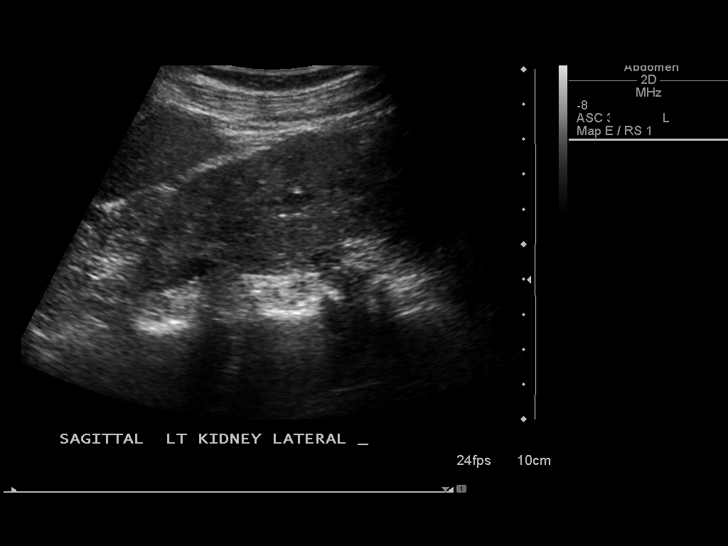
[im 65/98]
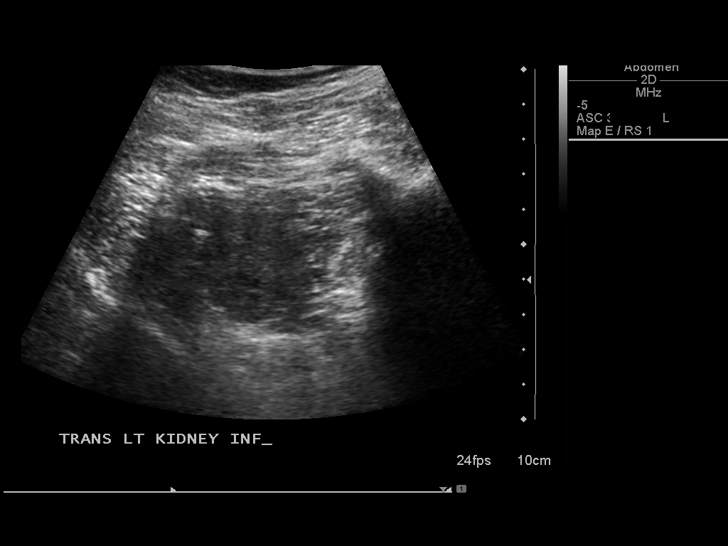
[im 73/98]
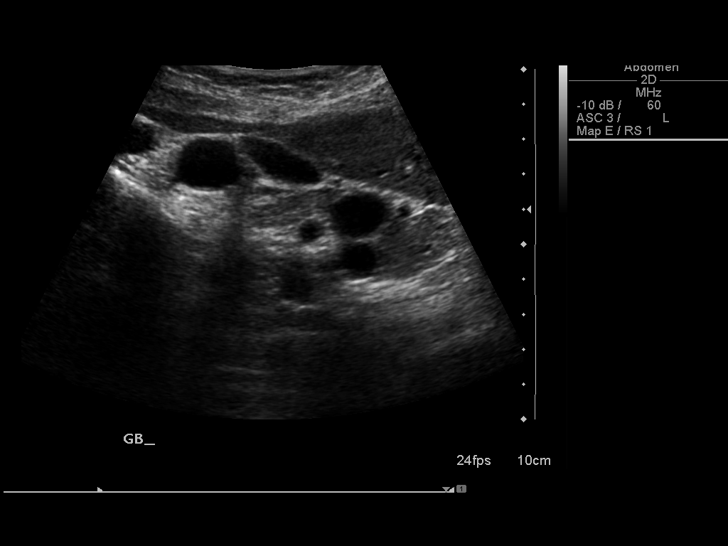
[im 81/98]
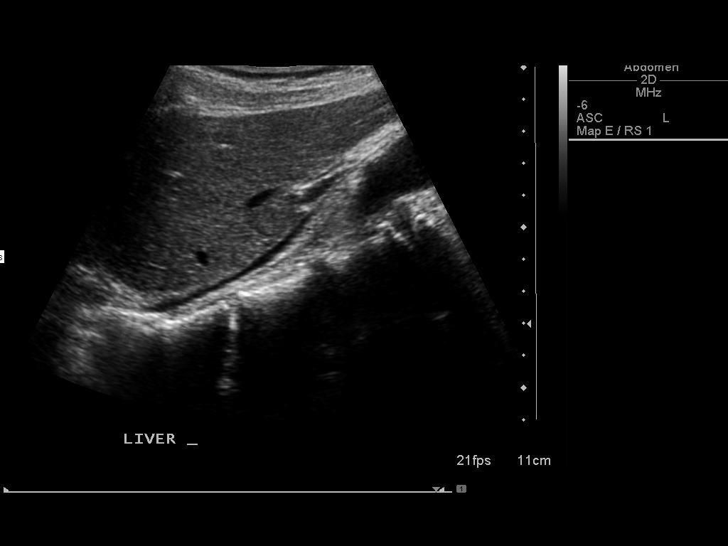
[im 89/98]
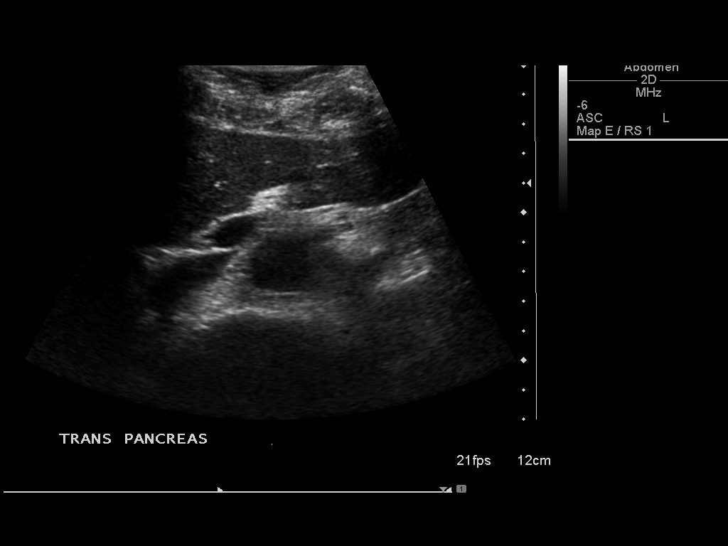
[im 98/98]
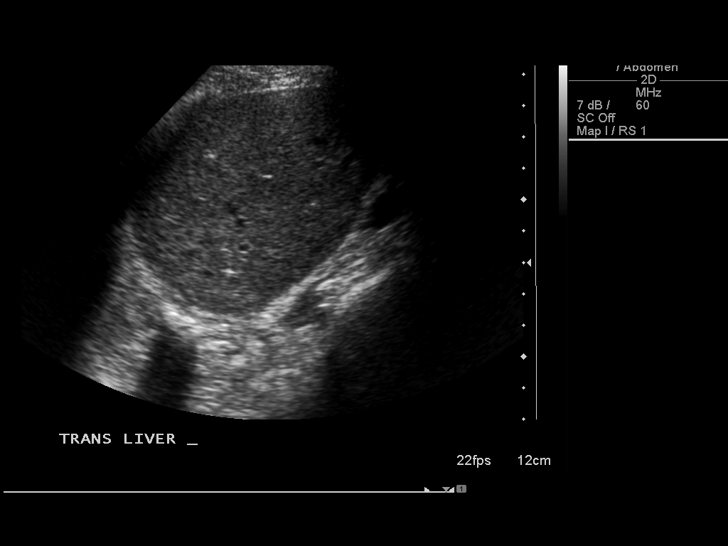

[14 of 25 positions shown; findings below may reference images not displayed]

FINDINGS: Gallbladder:  No gallstones, gallbladder wall thickening, or
pericholecystic fluid.

Common bile duct:  Normal in caliber measuring a maximum of 2.0mm.

Liver:  The liver is sonographically unremarkable.  There is normal
echogenicity without focal lesions or intrahepatic biliary
dilatation.

IVC:  Normal caliber.

Pancreas:  Limited visualization.

Spleen:  Normal size and echogenicity without focal lesions.

Right Kidney:  9.8 cm in length. Normal renal cortical thickness
and echogenicity without focal lesions or hydronephrosis.

Left Kidney:  9.9 cm in length. Normal renal cortical thickness and
echogenicity without focal lesions or hydronephrosis.

Abdominal aorta:  Normal caliber.  Atherosclerotic calcifications
are noted.
IMPRESSION: Unremarkable abdominal ultrasound examination.  Limited evaluation
of the pancreas.

## 2021-04-01 ENCOUNTER — Encounter: Payer: Self-pay | Admitting: Internal Medicine
